# Patient Record
Sex: Female | Born: 1960 | Race: White | Hispanic: No | Marital: Single | Smoking: Never smoker
Health system: Southern US, Community
[De-identification: ages and names within clinical notes are randomized; demographics above are authoritative.]

## PROBLEM LIST (undated history)

## (undated) DIAGNOSIS — Z859 Personal history of malignant neoplasm, unspecified: Secondary | ICD-10-CM

## (undated) HISTORY — PX: TONSILLECTOMY: SUR1361

## (undated) HISTORY — PX: TYMPANOSTOMY: SHX2586

## (undated) HISTORY — PX: LEG SURGERY: SHX1003

## (undated) HISTORY — PX: OTHER SURGICAL HISTORY: SHX169

## (undated) HISTORY — PX: ABDOMINAL HYSTERECTOMY: SHX81

---

## 2017-01-24 ENCOUNTER — Emergency Department: Payer: Self-pay

## 2017-01-24 ENCOUNTER — Emergency Department
Admission: EM | Admit: 2017-01-24 | Discharge: 2017-01-27 | Disposition: A | Discharge status: Other Acute Inpatient Hospital | Payer: Self-pay | Attending: Emergency Medicine | Admitting: Emergency Medicine

## 2017-01-24 ENCOUNTER — Other Ambulatory Visit: Payer: Self-pay

## 2017-01-24 ENCOUNTER — Encounter: Payer: Self-pay | Admitting: Emergency Medicine

## 2017-01-24 DIAGNOSIS — Y9389 Activity, other specified: Secondary | ICD-10-CM | POA: Insufficient documentation

## 2017-01-24 DIAGNOSIS — S161XXA Strain of muscle, fascia and tendon at neck level, initial encounter: Secondary | ICD-10-CM | POA: Insufficient documentation

## 2017-01-24 DIAGNOSIS — Y999 Unspecified external cause status: Secondary | ICD-10-CM | POA: Insufficient documentation

## 2017-01-24 DIAGNOSIS — M25511 Pain in right shoulder: Secondary | ICD-10-CM | POA: Insufficient documentation

## 2017-01-24 DIAGNOSIS — F23 Brief psychotic disorder: Secondary | ICD-10-CM | POA: Insufficient documentation

## 2017-01-24 DIAGNOSIS — Y92410 Unspecified street and highway as the place of occurrence of the external cause: Secondary | ICD-10-CM | POA: Insufficient documentation

## 2017-01-24 DIAGNOSIS — F4325 Adjustment disorder with mixed disturbance of emotions and conduct: Secondary | ICD-10-CM

## 2017-01-24 HISTORY — DX: Personal history of malignant neoplasm, unspecified: Z85.9

## 2017-01-24 LAB — URINALYSIS, COMPLETE (UACMP) WITH MICROSCOPIC
BACTERIA UA: NONE SEEN
Bilirubin Urine: NEGATIVE
Glucose, UA: NEGATIVE mg/dL
Hgb urine dipstick: NEGATIVE
Ketones, ur: 5 mg/dL — AB
LEUKOCYTES UA: NEGATIVE
NITRITE: NEGATIVE
PH: 5 (ref 5.0–8.0)
Protein, ur: 30 mg/dL — AB
SPECIFIC GRAVITY, URINE: 1.033 — AB (ref 1.005–1.030)

## 2017-01-24 LAB — URINE DRUG SCREEN, QUALITATIVE (ARMC ONLY)
Amphetamines, Ur Screen: NOT DETECTED
Barbiturates, Ur Screen: NOT DETECTED
Benzodiazepine, Ur Scrn: NOT DETECTED
COCAINE METABOLITE, UR ~~LOC~~: NOT DETECTED
Cannabinoid 50 Ng, Ur ~~LOC~~: NOT DETECTED
MDMA (ECSTASY) UR SCREEN: NOT DETECTED
METHADONE SCREEN, URINE: NOT DETECTED
OPIATE, UR SCREEN: NOT DETECTED
Phencyclidine (PCP) Ur S: NOT DETECTED
TRICYCLIC, UR SCREEN: NOT DETECTED

## 2017-01-24 LAB — CBC WITH DIFFERENTIAL/PLATELET
BASOS ABS: 0.1 10*3/uL (ref 0–0.1)
Basophils Relative: 1 %
EOS ABS: 0.2 10*3/uL (ref 0–0.7)
EOS PCT: 2 %
HCT: 40.9 % (ref 35.0–47.0)
Hemoglobin: 13.7 g/dL (ref 12.0–16.0)
LYMPHS PCT: 32 %
Lymphs Abs: 3.1 10*3/uL (ref 1.0–3.6)
MCH: 29 pg (ref 26.0–34.0)
MCHC: 33.5 g/dL (ref 32.0–36.0)
MCV: 86.6 fL (ref 80.0–100.0)
MONO ABS: 0.8 10*3/uL (ref 0.2–0.9)
Monocytes Relative: 9 %
Neutro Abs: 5.5 10*3/uL (ref 1.4–6.5)
Neutrophils Relative %: 56 %
PLATELETS: 250 10*3/uL (ref 150–440)
RBC: 4.72 MIL/uL (ref 3.80–5.20)
RDW: 14.2 % (ref 11.5–14.5)
WBC: 9.7 10*3/uL (ref 3.6–11.0)

## 2017-01-24 LAB — SALICYLATE LEVEL

## 2017-01-24 LAB — COMPREHENSIVE METABOLIC PANEL
ALK PHOS: 76 U/L (ref 38–126)
ALT: 24 U/L (ref 14–54)
AST: 26 U/L (ref 15–41)
Albumin: 4.7 g/dL (ref 3.5–5.0)
Anion gap: 9 (ref 5–15)
BILIRUBIN TOTAL: 1.3 mg/dL — AB (ref 0.3–1.2)
BUN: 14 mg/dL (ref 6–20)
CALCIUM: 9.6 mg/dL (ref 8.9–10.3)
CHLORIDE: 105 mmol/L (ref 101–111)
CO2: 25 mmol/L (ref 22–32)
CREATININE: 0.83 mg/dL (ref 0.44–1.00)
Glucose, Bld: 158 mg/dL — ABNORMAL HIGH (ref 65–99)
Potassium: 3.6 mmol/L (ref 3.5–5.1)
Sodium: 139 mmol/L (ref 135–145)
TOTAL PROTEIN: 8.6 g/dL — AB (ref 6.5–8.1)

## 2017-01-24 LAB — ACETAMINOPHEN LEVEL

## 2017-01-24 LAB — ETHANOL

## 2017-01-24 MED ORDER — OLANZAPINE 2.5 MG PO TABS
2.5000 mg | ORAL_TABLET | Freq: Every day | ORAL | Status: DC
  Administered 2017-01-24 – 2017-01-26 (×3): 2.5 mg via ORAL
  Filled 2017-01-24 (×3): qty 1

## 2017-01-24 MED ORDER — ACETAMINOPHEN 325 MG PO TABS
ORAL_TABLET | ORAL | Status: AC
  Filled 2017-01-24: qty 2

## 2017-01-24 MED ORDER — OLANZAPINE 5 MG PO TABS: 7.5000 mg | ORAL_TABLET | Freq: Two times a day (BID) | ORAL | Status: DC | PRN

## 2017-01-24 MED ORDER — ACETAMINOPHEN 325 MG PO TABS
650.0000 mg | ORAL_TABLET | Freq: Once | ORAL | Status: AC
  Administered 2017-01-24: 650 mg via ORAL
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
## 2017-01-24 NOTE — ED Notes (Signed)
IVC 

## 2017-01-24 NOTE — ED Provider Notes (Signed)
Medical screening examination/treatment/procedure(s) were conducted as a shared visit with non-physician practitioner(s) and myself.  I personally evaluated the patient during the encounter.  Agree with his trauma evaluation including x-ray of the shoulder and cervical spine, no need for CT scans at this time, low suspicion of intracranial hemorrhage or vascular, hollow viscus, or solid organ injury.  Notably, patient has bizarre behavior and appears to be manic. She was IVC by her family. We will maintain this pending psychiatry evaluation. We'll give calming agents as needed for agitation. Patient is currently calm, but with any mention of mental health concerned she becomes irritated and confrontational.    ----------------------------------------- 6:35 PM on 01/24/2017 -----------------------------------------  X-rays of C-spine and shoulder are unremarkable. Psych consult ordered. Patient informed of plan here in the ED.     ----------------------------------------- 7:29 PM on 01/24/2017 -----------------------------------------  Daughter called, reported to the nurse that patient has been having episodes like this over the past 3 years ever since her husband died and that January 8 is the anniversary of her husband's death and so her mom's mental health has deteriorated since then again. Patient is medically stable. Labs are pending. We'll await psychiatric evaluation.    Sharman CheekStafford, Tava Peery, MD 01/24/17 1930

## 2017-01-24 NOTE — ED Notes (Signed)
Pt ambulatory with steady gait to BHU with Susy FrizzleMatt, RN and ODS officer
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
## 2017-01-24 NOTE — ED Notes (Signed)
Matt, RN in with pt with Zyprexa printout and medications as ordered

## 2017-01-24 NOTE — ED Notes (Addendum)
Pt remains calm and cooperative; pt understands she'll be moving soon to the BHU; discussed ordered medication; pt would like a print out about the medication before taking;

## 2017-01-24 NOTE — ED Notes (Signed)

## 2017-01-24 NOTE — ED Notes (Signed)
Matt, RN at bedside to discuss with pt the IVC order and moving her to BHU: pt remains calm and cooperative

## 2017-01-24 NOTE — ED Notes (Signed)
Pt. Talking to TTS in room.

## 2017-01-24 NOTE — ED Notes (Signed)
Blood drawn by Selena BattenKim, RN; pt tolerated well; [facility] and awaiting evaluation

## 2017-01-24 NOTE — ED Notes (Signed)
Patients daughter called to check on status of mother.  Lauren(daughter) gave phone #[phone].  Stated she was the one who started IVC paperwork.  Lauren states Zella BallRobin has been displaying more frequent odd behavior in the past couple months.  Lauren states Zella BallRobin has a long hx of mental problems which resulted in her being raised by her grandparents(Biana's parents).

## 2017-01-24 NOTE — ED Notes (Signed)
BEHAVIORAL HEALTH ROUNDING Patient sleeping: Yes.   Patient alert and oriented: yes Behavior appropriate: no; If no, describe: paranoid, delusional Nutrition and fluids offered: Yes  Toileting and hygiene offered: Yes  Sitter present: not applicable Law enforcement present: Yes

## 2017-01-24 NOTE — ED Notes (Signed)
Pt. Informed of camera in unit and 15 minute checks.
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
## 2017-01-24 NOTE — ED Notes (Signed)
SOC called for consult  [phone]

## 2017-01-24 NOTE — ED Notes (Signed)
[facility] and patiently currently speaking with him
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
## 2017-01-24 NOTE — ED Notes (Signed)
Pt seen ambulating around flex wait area while waiting for room

## 2017-01-24 NOTE — ED Notes (Signed)
Called pt's daughter, Leotis ShamesLauren at [phone], at [facility] of [facility] to let her know he would be calling from a strange area code; she will be waiting for his call

## 2017-01-24 NOTE — ED Provider Notes (Signed)
Discussed with psychiatrist, recommends inpatient psychiatric hospitalization under involuntary commitment. Recommend starting Zyprexa which I will order. We'll await behavioral medicine placement.   Sharman CheekStafford, Eban Weick, MD 01/24/17 2008

## 2017-01-25 MED ORDER — ACETAMINOPHEN 325 MG PO TABS
650.0000 mg | ORAL_TABLET | Freq: Once | ORAL | Status: AC
  Administered 2017-01-25: 650 mg via ORAL
  Filled 2017-01-25: qty 2

## 2017-01-25 MED ORDER — GUAIFENESIN 200 MG PO TABS
600.0000 mg | ORAL_TABLET | Freq: Four times a day (QID) | ORAL | Status: DC | PRN
  Filled 2017-01-25: qty 3

## 2017-01-25 MED ORDER — BENZONATATE 100 MG PO CAPS
100.0000 mg | ORAL_CAPSULE | Freq: Two times a day (BID) | ORAL | Status: DC | PRN
  Administered 2017-01-25 – 2017-01-27 (×4): 100 mg via ORAL
  Filled 2017-01-25 (×4): qty 1

## 2017-01-25 MED ORDER — GUAIFENESIN ER 600 MG PO TB12
600.0000 mg | ORAL_TABLET | Freq: Two times a day (BID) | ORAL | Status: DC | PRN
  Administered 2017-01-25 – 2017-01-27 (×4): 600 mg via ORAL
  Filled 2017-01-25 (×4): qty 1

## 2017-01-25 MED ORDER — ACETAMINOPHEN 325 MG PO TABS
650.0000 mg | ORAL_TABLET | Freq: Four times a day (QID) | ORAL | Status: DC | PRN
  Administered 2017-01-25 – 2017-01-26 (×2): 650 mg via ORAL
  Filled 2017-01-25 (×2): qty 2

## 2017-01-25 MED ORDER — GUAIFENESIN 200 MG PO TABS
400.0000 mg | ORAL_TABLET | Freq: Four times a day (QID) | ORAL | Status: DC | PRN
  Filled 2017-01-25: qty 2

## 2017-01-25 MED ORDER — BENZONATATE 100 MG PO CAPS
ORAL_CAPSULE | ORAL | Status: AC
  Administered 2017-01-25: 100 mg via ORAL
  Filled 2017-01-25: qty 1

## 2017-01-25 NOTE — ED Notes (Signed)
Pt said she wants her best friend, Ruth Hood [phone]) to be her emergency contact and she does not want any information shared with her daughter, Ruth Hood.

## 2017-01-25 NOTE — ED Notes (Signed)
Pt had a visitor, Donell SievertCraig Thompson, who said he was her attorney and asked to see her IVC paperwork. Spoke with [facility], [facility], and Keturah Barreoni Barlett, Unit Director, who said that papers could not be shared and must come from medical records. That number was given to Mr. Janee Mornhompson, who was escorted off unit after visitation hours.

## 2017-01-25 NOTE — ED Notes (Signed)
IVC 

## 2017-01-25 NOTE — ED Provider Notes (Signed)
-----------------------------------------   7:00 AM on 01/25/2017 -----------------------------------------   Blood pressure (!) 147/85, pulse 93, temperature (!) 97.3 F (36.3 C), temperature source Oral, resp. rate 20, height 5\' 4"  (1.626 m), weight 68 kg (150 lb), SpO2 96 %.  The patient had no acute events since last update.  Calm and cooperative at this time.  Disposition is pending Psychiatry/Behavioral Medicine team recommendations.     Rebecka ApleyWebster, Allison P, MD 01/25/17 0700

## 2017-01-25 NOTE — ED Notes (Signed)
Lunch tray given. 

## 2017-01-25 NOTE — ED Notes (Signed)
Dinner tray given
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
## 2017-01-25 NOTE — ED Notes (Signed)
Pt insists she would like her code to be "[employer]." She does not want her daughter to get updates or have her code.

## 2017-01-25 NOTE — BH Assessment (Signed)
Pending review with [facility] ([phone]) 

## 2017-01-25 NOTE — ED Notes (Signed)
After the [facility], this Clinical research associatewriter wrote down numbers from pt's phone. Pt was made aware that this was the one time her phone would be accessed while pt was in the BHU. Pt verbalized understanding.

## 2017-01-25 NOTE — ED Notes (Addendum)
Pt is in dayroom watching Delrae RendJoel Osteen on TV. She has been labile this a.m. -- polite and cooperative at times, irritable when requests go against unit rules. Pt says she wanted to go to law school and took two law classes, "so I know my rights and it is a violation of my rights" that she is no longer in possession of a sheet of paper with three phone numbers on it and she can not access her cell phone.   She denies SI, HI, and AVH. She denies feelings of paranoia. She is guarded and suspicious at times. She is now speaking with Loraine Gripheryl AC by request.

## 2017-01-25 NOTE — BH Assessment (Signed)
Referral information for Psychiatric Hospitalization faxed to;                           [facility] [phone]),        [facility] [phone] or [phone])               [facility] [phone]),         [facility] [phone]),         [phone]),         [place] [phone] or [phone]),               [place] [phone]

## 2017-01-25 NOTE — BH Assessment (Signed)
Per [facility] BMU "Rounding" Attending Physician (Dr. Isbell) the unit census will be holding at 22 till further notice. Thus, patient is being referred out. 
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
## 2017-01-26 DIAGNOSIS — F29 Unspecified psychosis not due to a substance or known physiological condition: Secondary | ICD-10-CM

## 2017-01-26 DIAGNOSIS — F4325 Adjustment disorder with mixed disturbance of emotions and conduct: Secondary | ICD-10-CM

## 2017-01-26 MED ORDER — IBUPROFEN 800 MG PO TABS
800.0000 mg | ORAL_TABLET | Freq: Three times a day (TID) | ORAL | Status: AC
  Administered 2017-01-26 (×2): 800 mg via ORAL
  Filled 2017-01-26 (×2): qty 1

## 2017-01-26 NOTE — ED Notes (Signed)
Retrieved telephone from patient.
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
## 2017-01-26 NOTE — ED Notes (Signed)
IVC/Pending Placement 

## 2017-01-26 NOTE — BH Assessment (Addendum)
Writer followed up with referrals;   [facility] ([phone]), No appropriate beds.   [facility] ([phone]), Information refaxed   [facility] ([phone]), Declined due to acuity.   [facility] ([phone]), Information refaxed   [facility] ([phone]), Pending Review   [facility] [phone]), Unable to reach anyone   [place] ([phone]), Pending review.   [facility] [phone]), Unable to reach anyone   [facility] ([phone]), Information faxed.

## 2017-01-26 NOTE — ED Notes (Signed)
Pt. Alert and oriented, warm and dry, in no distress. Pt. Denies SI, HI, and AVH. Pt complaining of coughing. Pt. Encouraged to let nursing staff know of any concerns or needs.

## 2017-01-26 NOTE — ED Notes (Signed)

## 2017-01-26 NOTE — ED Notes (Addendum)
Patient currently on the telephone speaking with her "patient advocate."  She is identified as a lady named Ruth Hood.  Patient states that when her advocate called earlier (during phone time) the telephone was charging so she was not able to speak with her.

## 2017-01-26 NOTE — ED Notes (Signed)
Patient come to nurses station complaining of coughing. This Clinical research associatewriter spoke to EDP. Orders received for medication for coughing.

## 2017-01-26 NOTE — Progress Notes (Signed)
Pt A & O X4. Denies SI, HI, AVH and pain at this time. Presents anxious with labile mood throughout this shift. Tearful at times, demanding, abrupt, brief and needy on interactions as well. Per pt "I will call my lawyer, he will come back in here then you people will do as I want, I also want to talk to a hospital administrator" when she doesn't get whatever she demands immediately or when unit rules are reviewed. Pt was assessed my EDP for coughing episodes and pain unrelieved by PRN Tylenol; new order for Motrin 800 mg PO (see emar) received. All Medications given as per MD's orders. Pt is compliant with medications when offered. Denies adverse drug reactions at this time. Emotional support and availability provided to pt throughout this shift. Pt has not exhibited self harm gestures thus far.

## 2017-01-26 NOTE — ED Provider Notes (Signed)
-----------------------------------------   7:10 AM on 01/26/2017 -----------------------------------------   Blood pressure 124/83, pulse 80, temperature (!) 97.5 F (36.4 C), temperature source Oral, resp. rate 18, height 5\' 4"  (1.626 m), weight 68 kg (150 lb), SpO2 100 %.  The patient had no acute events since last update.  Calm and cooperative at this time.  Disposition is pending Psychiatry/Behavioral Medicine team recommendations.     Myrna BlazerSchaevitz, Taletha Twiford Matthew, MD 01/26/17 [phone]

## 2017-01-26 NOTE — ED Notes (Signed)
AC at bedside speaking with patient.

## 2017-01-26 NOTE — ED Notes (Signed)
Patient requesting to take a shower. This Clinical research associatewriter got patient items to take a shower.

## 2017-01-26 NOTE — Progress Notes (Signed)
CH rounding on unit and patient asked if we could talk. Patient wanted her attorneys phone number, CH provided this at end of consult. Patient would like information on advanced directive; CH will provide a copy before end of shift, as there are none on the unit. Patient was talkative and asked what services we provided and what we had to offer. Patient [facility]. CH inquired with staff and staff stated patient had a bible and could have literature. Patient continued asking staff for various things and CH exited the meeting room.   01/26/17 1445  Clinical Encounter Type  Visited With Patient;Health care provider  Visit Type Initial;Spiritual support;Behavioral Health  Referral From Patient  Spiritual Encounters  Spiritual Needs Brochure;Emotional
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
## 2017-01-26 NOTE — ED Notes (Signed)
Pt requesting a ginger ale stating her stomach is up set. This Clinical research associatewriter gave patient a ginger ale.
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
## 2017-01-27 ENCOUNTER — Other Ambulatory Visit: Payer: Self-pay

## 2017-01-27 ENCOUNTER — Inpatient Hospital Stay
Admission: AD | Admit: 2017-01-27 | Discharge: 2017-01-29 | DRG: 882 | Disposition: A | Discharge status: Home / Self Care | Payer: No Typology Code available for payment source | Attending: Psychiatry | Admitting: Psychiatry

## 2017-01-27 DIAGNOSIS — F4325 Adjustment disorder with mixed disturbance of emotions and conduct: Principal | ICD-10-CM | POA: Diagnosis present

## 2017-01-27 DIAGNOSIS — F310 Bipolar disorder, current episode hypomanic: Secondary | ICD-10-CM | POA: Diagnosis present

## 2017-01-27 DIAGNOSIS — K219 Gastro-esophageal reflux disease without esophagitis: Secondary | ICD-10-CM | POA: Diagnosis present

## 2017-01-27 DIAGNOSIS — Z90710 Acquired absence of both cervix and uterus: Secondary | ICD-10-CM

## 2017-01-27 DIAGNOSIS — F29 Unspecified psychosis not due to a substance or known physiological condition: Secondary | ICD-10-CM | POA: Diagnosis present

## 2017-01-27 DIAGNOSIS — G4700 Insomnia, unspecified: Secondary | ICD-10-CM | POA: Diagnosis present

## 2017-01-27 DIAGNOSIS — Z882 Allergy status to sulfonamides status: Secondary | ICD-10-CM

## 2017-01-27 MED ORDER — TEMAZEPAM 15 MG PO CAPS: 15.0000 mg | ORAL_CAPSULE | Freq: Every evening | ORAL | Status: DC | PRN

## 2017-01-27 MED ORDER — HYDROXYZINE HCL 25 MG PO TABS: 25.0000 mg | ORAL_TABLET | Freq: Three times a day (TID) | ORAL | Status: DC | PRN

## 2017-01-27 MED ORDER — IBUPROFEN 800 MG PO TABS
800.0000 mg | ORAL_TABLET | Freq: Three times a day (TID) | ORAL | Status: AC
  Filled 2017-01-27: qty 1

## 2017-01-27 MED ORDER — ALUM & MAG HYDROXIDE-SIMETH 200-200-20 MG/5ML PO SUSP
30.0000 mL | ORAL | Status: DC | PRN
  Administered 2017-01-27 – 2017-01-28 (×2): 30 mL via ORAL
  Filled 2017-01-27 (×2): qty 30

## 2017-01-27 MED ORDER — OLANZAPINE 10 MG PO TABS
10.0000 mg | ORAL_TABLET | Freq: Every day | ORAL | Status: DC
  Administered 2017-01-27 – 2017-01-28 (×2): 10 mg via ORAL
  Filled 2017-01-27 (×2): qty 1

## 2017-01-27 MED ORDER — OLANZAPINE 5 MG PO TABS: 2.5000 mg | ORAL_TABLET | Freq: Every day | ORAL | Status: DC

## 2017-01-27 MED ORDER — BENZONATATE 100 MG PO CAPS
100.0000 mg | ORAL_CAPSULE | Freq: Two times a day (BID) | ORAL | Status: DC | PRN
  Administered 2017-01-27 – 2017-01-28 (×2): 100 mg via ORAL
  Filled 2017-01-27 (×3): qty 1

## 2017-01-27 MED ORDER — IBUPROFEN 800 MG PO TABS
800.0000 mg | ORAL_TABLET | Freq: Three times a day (TID) | ORAL | Status: DC
  Administered 2017-01-27: 800 mg via ORAL

## 2017-01-27 MED ORDER — MAGNESIUM HYDROXIDE 400 MG/5ML PO SUSP: 30.0000 mL | Freq: Every day | ORAL | Status: DC | PRN

## 2017-01-27 MED ORDER — ACETAMINOPHEN 325 MG PO TABS
650.0000 mg | ORAL_TABLET | Freq: Four times a day (QID) | ORAL | Status: DC | PRN
  Administered 2017-01-27 – 2017-01-29 (×2): 650 mg via ORAL
  Filled 2017-01-27 (×2): qty 2

## 2017-01-27 MED ORDER — OLANZAPINE 5 MG PO TABS: 7.5000 mg | ORAL_TABLET | Freq: Two times a day (BID) | ORAL | Status: DC | PRN

## 2017-01-27 MED ORDER — ACETAMINOPHEN 325 MG PO TABS: 650.0000 mg | ORAL_TABLET | Freq: Four times a day (QID) | ORAL | Status: DC | PRN

## 2017-01-27 MED ORDER — GUAIFENESIN ER 600 MG PO TB12
600.0000 mg | ORAL_TABLET | Freq: Two times a day (BID) | ORAL | Status: DC | PRN
  Administered 2017-01-29: 600 mg via ORAL
  Filled 2017-01-27 (×2): qty 1

## 2017-01-27 MED ORDER — TRAZODONE HCL 100 MG PO TABS: 100.0000 mg | ORAL_TABLET | Freq: Every evening | ORAL | Status: DC | PRN

## 2017-01-27 NOTE — ED Notes (Signed)
Pt IVC pending placement to [facility]. 

## 2017-01-27 NOTE — ED Notes (Signed)
Patient IVC.  No signature pad for e signature.
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
## 2017-01-27 NOTE — ED Notes (Addendum)
Preparing patient for transfer to BMU 

## 2017-01-27 NOTE — ED Notes (Addendum)
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  You know that right?  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  It's only going to take me 2 to 3 hours.  It's going to be a best seller.  It's going to be a [employer] movie."

## 2017-01-27 NOTE — BH Assessment (Signed)
[facility] by Dr. Toni Amendlapacs.  Attending Physician will be Dr. Karie SodaPucilowksa.   Patient has been [facility] to room 313, [facility] Charge Nurse [place].   Intake Paper Work has been signed and placed on patient chart.  ER staff is aware of the admission (Emilie, ER Kathrynn SpeedSectary, Dr. Shaune PollackLord, , ER MD; Bertram SavinLord & Joshua, Patient Access).

## 2017-01-27 NOTE — Consult Note (Signed)
[facility] Face-to-Face Psychiatry Consult   Reason for Consult: Consult for 57 year old woman with a history of odd behavior.  Follow-up from notes yesterday Referring Physician: Juliette Alcide Patient Identification: Ruth Hood MRN:  [id number] Principal Diagnosis: Psychosis [facility]) Diagnosis:   Patient Active Problem List   Diagnosis Date Noted  . Psychosis (HCC) [F29] 01/26/2017    Priority: High    Total Time spent with patient: 30 minutes  Subjective:   Ruth Hood is a 57 y.o. female patient admitted with "when can I go home".  HPI: Patient interviewed.  See notes from yesterday.  This is a 57 year old woman brought into the hospital and placed under IVC by family who alleged that there is been more bizarre behavior outside the hospital.  Some bizarre behavior noted by staff when she first presented.  Here in the emergency room overnight the patient remains intrusive and demanding although not obviously psychotic.  She was compliant with medicine including olanzapine.  Past Psychiatric History: Unclear if she is ever had any psychiatric contact.  She denies it.  Risk to Self: Suicidal Ideation: No-Not Currently/Within Last 6 Months Suicidal Intent: No Is patient at risk for suicide?: No Suicidal Plan?: No Access to Means: No What has been your use of drugs/alcohol within the last 12 months?: Pt reports she ocassionally drinks alcohol How many times?: 0 Other Self Harm Risks: N/A Triggers for Past Attempts: None known Intentional Self Injurious Behavior: None Risk to Others: Homicidal Ideation: No Thoughts of Harm to Others: No Current Homicidal Intent: No Current Homicidal Plan: No Access to Homicidal Means: No Identified Victim: N/A History of harm to others?: No Assessment of Violence: On admission Violent Behavior Description: N/A Does patient have access to weapons?: No Criminal Charges Pending?: No Does patient have a court date: No Prior Inpatient Therapy: Prior Inpatient  Therapy: No Prior Therapy Dates: N/A Prior Therapy Facilty/Provider(s): N/A Reason for Treatment: N/A Prior Outpatient Therapy: Prior Outpatient Therapy: No Prior Therapy Dates: N/A Prior Therapy Facilty/Provider(s): N/A Reason for Treatment: N/A Does patient have an ACCT team?: No Does patient have Intensive In-House Services?  : No Does patient have [facility] services? : No Does patient have P4CC services?: No  Past Medical History:  Past Medical History:  Diagnosis Date  . History of cancer    "it had cancer cells"    Past Surgical History:  Procedure Laterality Date  . ABDOMINAL HYSTERECTOMY    . arm surgery    . LEG SURGERY    . TONSILLECTOMY    . TYMPANOSTOMY     Family History: History reviewed. No pertinent family history. Family Psychiatric  History: Unknown.  She claims that her biological daughter is mentally ill.  I am not sure who to believe. Social History:  Social History   Substance and Sexual Activity  Alcohol Use No  . Frequency: Never     Social History   Substance and Sexual Activity  Drug Use No    Social History   Socioeconomic History  . Marital status: Single    Spouse name: None  . Number of children: None  . Years of education: None  . Highest education level: None  Social Needs  . Financial resource strain: None  . Food insecurity - worry: None  . Food insecurity - inability: None  . Transportation needs - medical: None  . Transportation needs - non-medical: None  Occupational History  . None  Tobacco Use  . Smoking status: Never Smoker  . Smokeless tobacco:  Never Used  Substance and Sexual Activity  . Alcohol use: No    Frequency: Never  . Drug use: No  . Sexual activity: None  Other Topics Concern  . None  Social History Narrative  . None   Additional Social History:    Allergies:   Allergies  Allergen Reactions  . Sulfa Antibiotics     Unknown as kid    Labs: No results found for this or any previous visit  (from the past 48 hour(s)).  Current Facility-Administered Medications  Medication Dose Route Frequency Provider Last Rate Last Dose  . acetaminophen (TYLENOL) tablet 650 mg  650 mg Oral Q6H PRN Dionne BucySiadecki, Sebastian, MD   650 mg at 01/26/17 0858  . benzonatate (TESSALON) capsule 100 mg  100 mg Oral BID PRN Minna AntisPaduchowski, Kevin, MD   100 mg at 01/27/17 1134  . guaiFENesin (MUCINEX) 12 hr tablet 600 mg  600 mg Oral BID PRN Dionne BucySiadecki, Sebastian, MD   600 mg at 01/27/17 1205  . ibuprofen (ADVIL,MOTRIN) tablet 800 mg  800 mg Oral TID Arnaldo NatalMalinda, Paul F, MD   800 mg at 01/27/17 1344  . OLANZapine (ZYPREXA) tablet 2.5 mg  2.5 mg Oral QHS Sharman CheekStafford, Phillip, MD   2.5 mg at 01/26/17 2259  . OLANZapine (ZYPREXA) tablet 7.5 mg  7.5 mg Oral BID PRN Sharman CheekStafford, Phillip, MD       No current outpatient medications on file.    Musculoskeletal: Strength & Muscle Tone: within normal limits Gait & Station: normal Patient leans: N/A  Psychiatric Specialty Exam: Physical Exam  Nursing note reviewed. Constitutional: She appears well-developed and well-nourished.  HENT:  Head: Normocephalic and atraumatic.  Eyes: Conjunctivae are normal. Pupils are equal, round, and reactive to light.  Neck: Normal range of motion.  Cardiovascular: Regular rhythm and normal heart sounds.  Respiratory: Effort normal. No respiratory distress.  GI: Soft.  Musculoskeletal: Normal range of motion.  Neurological: She is alert.  Skin: Skin is warm and dry.  Psychiatric: Her speech is normal. Her mood appears anxious. She is agitated and hyperactive. She is not aggressive and not combative. Thought content is not paranoid. Cognition and memory are normal. She expresses impulsivity. She expresses no homicidal and no suicidal ideation.    Review of Systems  Constitutional: Negative.   HENT: Negative.   Eyes: Negative.   Respiratory: Negative.   Cardiovascular: Negative.   Gastrointestinal: Negative.   Musculoskeletal: Negative.    Skin: Negative.   Neurological: Negative.   Psychiatric/Behavioral: Negative for depression, hallucinations, memory loss, substance abuse and suicidal ideas. The patient is nervous/anxious. The patient does not have insomnia.     Blood pressure 117/66, pulse 83, temperature (!) 97.5 F (36.4 C), temperature source Oral, resp. rate 18, height 5\' 4"  (1.626 m), weight 68 kg (150 lb), SpO2 100 %.Body mass index is 25.75 kg/m.  General Appearance: Casual  Eye Contact:  Good  Speech:  Clear and Coherent  Volume:  Normal  Mood:  Euthymic  Affect:  Appropriate  Thought Process:  Goal Directed  Orientation:  Full (Time, Place, and Person)  Thought Content:  Logical and Rumination  Suicidal Thoughts:  No  Homicidal Thoughts:  No  Memory:  Immediate;   Good Recent;   Fair Remote;   Fair  Judgement:  Fair  Insight:  Shallow  Psychomotor Activity:  Restlessness  Concentration:  Concentration: Fair  Recall:  FiservFair  Fund of Knowledge:  Fair  Language:  Fair  Akathisia:  No  Handed:  Right  AIMS (if indicated):     Assets:  Architect Housing Physical Health Resilience  ADL's:  Intact  Cognition:  WNL  Sleep:        Treatment Plan Summary: Plan 22 woman who continues to have some bizarre and odd behavior although she has not been violent or aggressive or threatening.  IVC is already in place.  Bed is reportedly going to be available today.  It still seems the better judgment to proceed with the original plan of admitting her to the psychiatric unit for more full evaluation.  All of this explained to the patient.  No change to orders for now.  He should be able to get downstairs by the end of the day.  Disposition: Recommend psychiatric Inpatient admission when medically cleared.  Mordecai Rasmussen, MD 01/27/2017 6:05 PM
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
## 2017-01-27 NOTE — ED Notes (Signed)
Patient at nurse's station requesting water.  Water given to patient.

## 2017-01-27 NOTE — ED Provider Notes (Signed)
-----------------------------------------   7:47 AM on 01/27/2017 -----------------------------------------   Blood pressure 132/69, pulse 78, temperature (!) 97.4 F (36.3 C), temperature source Axillary, resp. rate 16, height 5\' 4"  (1.626 m), weight 68 kg (150 lb), SpO2 100 %.  The patient had no acute events since last update.  Calm and cooperative at this time.  Disposition is pending Psychiatry/Behavioral Medicine team recommendations.     Rockne MenghiniNorman, Anne-Caroline, MD 01/27/17 [id number]
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
## 2017-01-27 NOTE — Progress Notes (Signed)
Pt visible in milieu for majority of this shift.A & O X4. Denies SI, HI, AVH "I'm ready to go home, I'm loosing money everyday I'm in here, we are coming to the end of another business day". Pt presents restless / fidgety with rapid speech. Mood remains labile. Compliant with medications when offered. Denies adverse drug reactions. Pt pending placement to BMU, awaiting room assignment. Pt is aware of transfer and is in agreement. Safety checks maintained at Q 15 minutes intervals without self harm gestures thus far shift.

## 2017-01-28 DIAGNOSIS — F4325 Adjustment disorder with mixed disturbance of emotions and conduct: Principal | ICD-10-CM

## 2017-01-28 LAB — TSH: TSH: 2.075 u[IU]/mL (ref 0.350–4.500)

## 2017-01-28 LAB — HEMOGLOBIN A1C
HEMOGLOBIN A1C: 5.5 % (ref 4.8–5.6)
Mean Plasma Glucose: 111.15 mg/dL

## 2017-01-28 LAB — LIPID PANEL
CHOLESTEROL: 192 mg/dL (ref 0–200)
HDL: 53 mg/dL (ref >40)
LDL Cholesterol: 114 mg/dL — ABNORMAL HIGH (ref 0–99)
Total CHOL/HDL Ratio: 3.6 RATIO
Triglycerides: 127 mg/dL (ref <150)
VLDL: 25 mg/dL (ref 0–40)

## 2017-01-28 MED ORDER — TRAZODONE HCL 100 MG PO TABS
100.0000 mg | ORAL_TABLET | Freq: Every day | ORAL | Status: DC
  Administered 2017-01-28: 100 mg via ORAL
  Filled 2017-01-28: qty 1

## 2017-01-28 MED ORDER — PANTOPRAZOLE SODIUM 40 MG PO TBEC
40.0000 mg | DELAYED_RELEASE_TABLET | Freq: Every day | ORAL | Status: DC
  Administered 2017-01-28 – 2017-01-29 (×2): 40 mg via ORAL
  Filled 2017-01-28 (×2): qty 1
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
## 2017-01-28 NOTE — Plan of Care (Signed)
  Education: Utilization of techniques to improve thought processes will improve 01/28/2017 1552 - Not Progressing by Elige Radonobb, Nevaeha Finerty B, RN Note Does not think anything is wrong and does not feel that she needs to be here   Education: Knowledge of the prescribed therapeutic regimen will improve 01/28/2017 1552 - Progressing by Elige Radonobb, Glendal Cassaday B, RN Note Able to verbalize medications she is taking and what they are for 01/28/2017 1145 - Progressing by Elige Radonobb, Bejamin Hackbart B, RN   Coping: Ability to cope will improve 01/28/2017 1552 - Progressing by Elige Radonobb, Maleni Seyer B, RN   Coping: Ability to verbalize feelings will improve 01/28/2017 1552 - Progressing by Elige Radonobb, Felica Chargois B, RN Note Verbalizes feelings and concerns without difficulty.     Health Behavior/Discharge Planning: Compliance with therapeutic regimen will improve 01/28/2017 1552 - Progressing by Elige Radonobb, Colton Engdahl B, RN Note Attending groups, receptive to treatment regeiment   Safety: Ability to disclose and discuss suicidal ideas will improve 01/28/2017 1552 - Progressing by Elige Radonobb, Bayyinah Dukeman B, RN Note Denies SI  or any thoughts of harming self   Self-Concept: Ability to verbalize positive feelings about self will improve 01/28/2017 1552 - Progressing by Elige Radonobb, Daveon Arpino B, RN
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
## 2017-01-28 NOTE — BHH Group Notes (Signed)
  01/28/2017  Time: 0930  Type of Therapy/Topic:  Group Therapy:  Emotion Regulation  Participation Level:  Active   Description of Group:    The purpose of this group is to assist patients in learning to regulate negative emotions and experience positive emotions. Patients will be guided to discuss ways in which they have been vulnerable to their negative emotions. These vulnerabilities will be juxtaposed with experiences of positive emotions or situations, and patients will be challenged to use positive emotions to combat negative ones. Special emphasis will be placed on coping with negative emotions in conflict situations, and patients will process healthy conflict resolution skills.  Therapeutic Goals: 1. Patient will identify two positive emotions or experiences to reflect on in order to balance out negative emotions 2. Patient will label two or more emotions that they find the most difficult to experience 3. Patient will demonstrate positive conflict resolution skills through discussion and/or role plays  Summary of Patient Progress: Pt continues to work towards their tx goals but has not yet reached them. Pt was able to appropriately participate in group discussion, and was able to offer support/validation to other group members. Pt reported two things she can do to stay calm in the moment, when feeling upset are, "speaking things into existence and releasing endorphins." Pt was verbally intrusive at times, but was able to accept redirection when offered. Pt reported that she would like to lead a breathing exercise daily on the unit.   Therapeutic Modalities:   Cognitive Behavioral Therapy Feelings Identification Dialectical Behavioral Therapy  Heidi DachKelsey Baudelio Karnes, MSW, LCSW 01/28/2017 10:28 AM

## 2017-01-28 NOTE — Tx Team (Signed)
Interdisciplinary Treatment and Diagnostic Plan Update  01/28/2017 Time of Session: 10:30am Ruth Hood MRN: [id number]  Principal Diagnosis: Bipolar I disorder, current or most recent episode manic, with psychotic features (HCC)  Secondary Diagnoses: Principal Problem:   Bipolar I disorder, current or most recent episode manic, with psychotic features (HCC)   Current Medications:  Current Facility-Administered Medications  Medication Dose Route Frequency Provider Last Rate Last Dose  . acetaminophen (TYLENOL) tablet 650 mg  650 mg Oral Q6H PRN Clapacs, Jackquline DenmarkJohn T, MD   650 mg at 01/27/17 2242  . alum & mag hydroxide-simeth (MAALOX/MYLANTA) 200-200-20 MG/5ML suspension 30 mL  30 mL Oral Q4H PRN Clapacs, Jackquline DenmarkJohn T, MD   30 mL at 01/28/17 0249  . benzonatate (TESSALON) capsule 100 mg  100 mg Oral BID PRN Clapacs, Jackquline DenmarkJohn T, MD   100 mg at 01/27/17 2231  . guaiFENesin (MUCINEX) 12 hr tablet 600 mg  600 mg Oral BID PRN Clapacs, John T, MD      . hydrOXYzine (ATARAX/VISTARIL) tablet 25 mg  25 mg Oral TID PRN Clapacs, John T, MD      . magnesium hydroxide (MILK OF MAGNESIA) suspension 30 mL  30 mL Oral Daily PRN Clapacs, John T, MD      . OLANZapine (ZYPREXA) tablet 10 mg  10 mg Oral QHS Pucilowska, Jolanta B, MD   10 mg at 01/27/17 2217  . OLANZapine (ZYPREXA) tablet 7.5 mg  7.5 mg Oral BID PRN Clapacs, John T, MD      . temazepam (RESTORIL) capsule 15 mg  15 mg Oral QHS PRN Pucilowska, Jolanta B, MD       PTA Medications: No medications prior to admission.    Patient Stressors: Health problems Medication change or noncompliance Occupational concerns Traumatic event  Patient Strengths: Average or above average intelligence Capable of independent living Communication skills General fund of knowledge Motivation for treatment/growth Religious Affiliation  Treatment Modalities: Medication Management, Group therapy, Case management,  1 to 1 session with clinician, Psychoeducation, Recreational  therapy.   Physician Treatment Plan for Primary Diagnosis: Bipolar I disorder, current or most recent episode manic, with psychotic features (HCC) Long Term Goal(s):     Short Term Goals:    Medication Management: Evaluate patient's response, side effects, and tolerance of medication regimen.  Therapeutic Interventions: 1 to 1 sessions, Unit Group sessions and Medication administration.  Evaluation of Outcomes: Progressing  Physician Treatment Plan for Secondary Diagnosis: Principal Problem:   Bipolar I disorder, current or most recent episode manic, with psychotic features (HCC)  Long Term Goal(s):     Short Term Goals:       Medication Management: Evaluate patient's response, side effects, and tolerance of medication regimen.  Therapeutic Interventions: 1 to 1 sessions, Unit Group sessions and Medication administration.  Evaluation of Outcomes: Progressing   RN Treatment Plan for Primary Diagnosis: Bipolar I disorder, current or most recent episode manic, with psychotic features (HCC) Long Term Goal(s): Knowledge of disease and therapeutic regimen to maintain health will improve  Short Term Goals: Ability to identify and develop effective coping behaviors will improve and Compliance with prescribed medications will improve  Medication Management: RN will administer medications as ordered by provider, will assess and evaluate patient's response and provide education to patient for prescribed medication. RN will report any adverse and/or side effects to prescribing provider.  Therapeutic Interventions: 1 on 1 counseling sessions, Psychoeducation, Medication administration, Evaluate responses to treatment, Monitor vital signs and CBGs as ordered, Perform/monitor CIWA, COWS,  AIMS and Fall Risk screenings as ordered, Perform wound care treatments as ordered.  Evaluation of Outcomes: Progressing   LCSW Treatment Plan for Primary Diagnosis: Bipolar I disorder, current or most  recent episode manic, with psychotic features (HCC) Long Term Goal(s): Safe transition to appropriate next level of care at discharge, Engage patient in therapeutic group addressing interpersonal concerns.  Short Term Goals: Engage patient in aftercare planning with referrals and resources, Identify triggers associated with mental health/substance abuse issues and Increase skills for wellness and recovery  Therapeutic Interventions: Assess for all discharge needs, 1 to 1 time with Social worker, Explore available resources and support systems, Assess for adequacy in community support network, Educate family and significant other(s) on suicide prevention, Complete Psychosocial Assessment, Interpersonal group therapy.  Evaluation of Outcomes: Progressing   Progress in Treatment: Attending groups: Yes. Participating in groups: Yes. Taking medication as prescribed: Yes. Toleration medication: Yes. Family/Significant other contact made: No, will contact:    Patient understands diagnosis: No Discussing patient identified problems/goals with staff: Yes. Medical problems stabilized or resolved: Yes. Denies suicidal/homicidal ideation: Yes. Issues/concerns per patient self-inventory: No. Other:    New problem(s) identified: No, Describe:     New Short Term/Long Term Goal(s):To be discharged and work on getting her car and arranging for a rental car.  Discharge Plan or Barriers: Still assessing, most likely will return home and follow up with outpatient provider.  Reason for Continuation of Hospitalization: Mania Medication stabilization  Estimated Length of Stay: 5-7 days  Attendees: Patient:Ruth Hood 01/28/2017 4:34 PM  Physician: Kristine Linea, MD 01/28/2017 4:34 PM  Nursing: Hulan Amato, RN 01/28/2017 4:34 PM  RN Care Manager: 01/28/2017 4:34 PM  Social Worker: Jake Shark, LCSW 01/28/2017 4:34 PM  Recreational Therapist:  01/28/2017 4:34 PM  Other:  01/28/2017 4:34 PM  Other:   01/28/2017 4:34 PM  Other: 01/28/2017 4:34 PM    Scribe for Treatment Team: Glennon Mac, LCSW 01/28/2017 4:34 PM

## 2017-01-28 NOTE — Plan of Care (Signed)
  Progressing Education: Utilization of techniques to improve thought processes will improve 01/28/2017 2027 - Progressing by Lelan PonsAriwodo, Ireland Chagnon, RN Knowledge of the prescribed therapeutic regimen will improve 01/28/2017 2027 - Progressing by Lelan PonsAriwodo, Johna Kearl, RN Coping: Ability to cope will improve 01/28/2017 2027 - Progressing by Lelan PonsAriwodo, Lajoyce Tamura, RN Ability to verbalize feelings will improve 01/28/2017 2027 - Progressing by Lelan PonsAriwodo, Moiz Ryant, RN Health Behavior/Discharge Planning: Ability to make decisions will improve 01/28/2017 2027 - Progressing by Lelan PonsAriwodo, Jody Silas, RN Safety: Ability to disclose and discuss suicidal ideas will improve 01/28/2017 2027 - Progressing by Lelan PonsAriwodo, Ady Heimann, RN Self-Concept: Ability to verbalize positive feelings about self will improve 01/28/2017 2027 - Progressing by Lelan PonsAriwodo, Kerri Kovacik, RN Self-Concept: Level of anxiety will decrease 01/28/2017 2027 - Progressing by Lelan PonsAriwodo, Mersadie Kavanaugh, RN Nutrition: Adequate nutrition will be maintained 01/28/2017 2027 - Progressing by Lelan PonsAriwodo, Darris Staiger, RN Pain Managment: General experience of comfort will improve 01/28/2017 2027 - Progressing by Lelan PonsAriwodo, Keymoni Mccaster, RN Safety: Ability to remain free from injury will improve 01/28/2017 2027 - Progressing by Lelan PonsAriwodo, Azarria Balint, RN
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
## 2017-01-29 DIAGNOSIS — F310 Bipolar disorder, current episode hypomanic: Secondary | ICD-10-CM | POA: Diagnosis present

## 2017-01-29 DIAGNOSIS — K219 Gastro-esophageal reflux disease without esophagitis: Secondary | ICD-10-CM | POA: Diagnosis present

## 2017-01-29 MED ORDER — TRAZODONE HCL 100 MG PO TABS: 100.0000 mg | ORAL_TABLET | Freq: Every day | ORAL | 1 refills | Status: AC

## 2017-01-29 MED ORDER — OLANZAPINE 10 MG PO TABS: 10.0000 mg | ORAL_TABLET | Freq: Every day | ORAL | 1 refills | Status: AC

## 2017-01-29 NOTE — [facility] Suicide Risk Assessment (Signed)
[facility] Discharge Suicide Risk Assessment   Principal Problem: Adjustment disorder with mixed disturbance of emotions and conduct Discharge Diagnoses:  Patient Active Problem List   Diagnosis Date Noted  . Adjustment disorder with mixed disturbance of emotions and conduct [F43.25] 01/26/2017    Priority: High  . Bipolar disorder, current episode hypomanic ([place]) [F31.0] 01/29/2017  . GERD (gastroesophageal reflux disease) [K21.9] 01/29/2017    Total Time spent with patient: 30 minutes  Musculoskeletal: Strength & Muscle Tone: within normal limits Gait & Station: normal Patient leans: N/A  Psychiatric Specialty Exam: Review of Systems  Musculoskeletal: Positive for myalgias.  Neurological: Negative.   Psychiatric/Behavioral: The patient has insomnia.   All other systems reviewed and are negative.   Blood pressure 114/64, pulse 84, temperature 98.6 F (37 C), temperature source Oral, resp. rate 18, height _0  (1.626 m), weight 74.8 kg (165 lb), SpO2 99 %.Body mass index is 28.32 kg/m.  General Appearance: Casual  Eye Contact::  Good  Speech:  Clear and Coherent409  Volume:  Normal  Mood:  Euthymic  Affect:  Appropriate  Thought Process:  Goal Directed and Descriptions of Associations: Intact  Orientation:  Full (Time, Place, and Person)  Thought Content:  WDL  Suicidal Thoughts:  No  Homicidal Thoughts:  No  Memory:  Immediate;   Fair Recent;   Fair Remote;   Fair  Judgement:  Impaired  Insight:  Present  Psychomotor Activity:  Normal  Concentration:  Fair  Recall:  [employer] of Knowledge:Fair  Language: Fair  Akathisia:  No  Handed:  Right  AIMS (if indicated):     Assets:  Communication Skills Desire for Improvement Financial Resources/Insurance [place] Talents/Skills Transportation Vocational/Educational  Sleep:  Number of Hours: 7.15  Cognition: WNL  ADL's:  Intact   Mental Status Per Nursing Assessment::    On Admission:     Demographic Factors:  Divorced or widowed and Caucasian  Loss Factors: Loss of significant relationship  Historical Factors: Family history of mental illness or substance abuse and Impulsivity  Risk Reduction Factors:   Sense of responsibility to family, Living with another person, especially a relative and Positive social support  Continued Clinical Symptoms:  Severe Anxiety and/or Agitation  Cognitive Features That Contribute To Risk:  None    Suicide Risk:  Minimal: No identifiable suicidal ideation.  Patients presenting with no risk factors but with morbid ruminations; may be classified as minimal risk based on the severity of the depressive symptoms  Follow-up Information    [facility] Follow up on 02/04/2017.   Why:  Please met with Sherrian Divers at the office on 02/04/17 at 7:15 AM. Contact information: [place] [address] [phone]        [facility], [place] .   Contact information: [address] [place] [address] [phone]           Plan Of Care/Follow-up recommendations:  Activity:  as tolerated Diet:  low sodium heart healthy Other:  keep follow up appointments  Orson Slick, MD 01/29/2017, 10:00 AM

## 2017-01-29 NOTE — Progress Notes (Signed)
Patient is pleasant and cooperative.  Bright affect.  Logical and coherent although hyper verbal.  Denies SI/HI/AVH.   Discharge instructions given, verbalized understanding.  Informed that doctor has written for a seven day supply of medications.  Patient informed that she would return to pick up medications once they are ready. Prescription given.  Personal belongings returned.  Escorted off unit by this Clinical research associatewriter to main entrance to travel home with friend.
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
## 2017-01-29 NOTE — Progress Notes (Signed)
  [facility] Adult Case Management Discharge Plan :  Will you be returning to the same living situation after discharge:  Yes,  Pt will be discharging back to her home At discharge, do you have transportation home?: Yes,  Pt's family is providing transportation Do you have the ability to pay for your medications: Yes,  Pt has financial resources.  Release of information consent forms completed and in the chart;  Patient's signature needed at discharge.  Patient to Follow up at: Follow-up Information    [facility] Follow up on 02/04/2017.   Why:  Please met with Sherrian Divers at the office on 02/04/17 at 7:15 AM. Contact information: [place] [address] [phone]        [facility], [place] .   Contact information: [address] [place] [address] [phone]           Next level of care provider has access to [place] and Suicide Prevention discussed: Yes,  No safety issues identified  Have you used any form of tobacco in the last 30 days? (Cigarettes, Smokeless Tobacco, Cigars, and/or Pipes): No  Has patient been referred to the Quitline?: N/A patient is not a smoker  Patient has been referred for addiction treatment: [place], LCSW 01/29/2017, 9:50 AM

## 2017-01-29 NOTE — Plan of Care (Signed)
Patient is adjusting well and communicating with her peers adequately , patient contract for safety of self and others and denies any suicide ideations , support and encouragement is rendered and acknowledged , patient is sleeping long hours at a time , no distress noted.

## 2017-01-29 NOTE — BHH Group Notes (Signed)
01/29/2017 9:30AM  Type of Therapy/Topic:  Group Therapy:  Balance in Life  Participation Level:  Active  Description of Group:   This group will address the concept of balance and how it feels and looks when one is unbalanced. Patients will be encouraged to process areas in their lives that are out of balance and identify reasons for remaining unbalanced. Facilitators will guide patients in utilizing problem-solving interventions to address and correct the stressor making their life unbalanced. Understanding and applying boundaries will be explored and addressed for obtaining and maintaining a balanced life. Patients will be encouraged to explore ways to assertively make their unbalanced needs known to significant others in their lives, using other group members and facilitator for support and feedback.  Therapeutic Goals: 1. Patient will identify two or more emotions or situations they have that consume much of in their lives. 2. Patient will identify signs/triggers that life has become out of balance:  3. Patient will identify two ways to set boundaries in order to achieve balance in their lives:  4. Patient will demonstrate ability to communicate their needs through discussion and/or role plays  Summary of Patient Progress: Actively and appropriately engaged in the group. Patient was able to provide support and validation to other group members.Patient practiced active listening when interacting with the facilitator and other group members Patient in still in the process of obtaining treatment goals. Zella BallRobin says that being in here has caused her routine to be off balance. She says that when she gets discharged she will need to maintain that balance again.     Therapeutic Modalities:   Cognitive Behavioral Therapy Solution-Focused Therapy Assertiveness Training  Johny Shearsassandra  Abygayle D[place], [place]

## 2019-08-16 ENCOUNTER — Other Ambulatory Visit: Payer: Self-pay

## 2019-08-16 ENCOUNTER — Emergency Department: Payer: No Typology Code available for payment source

## 2019-08-16 ENCOUNTER — Encounter: Payer: Self-pay | Admitting: Emergency Medicine

## 2019-08-16 ENCOUNTER — Emergency Department
Admission: EM | Admit: 2019-08-16 | Discharge: 2019-08-16 | Disposition: A | Discharge status: Home / Self Care | Payer: No Typology Code available for payment source | Attending: Emergency Medicine | Admitting: Emergency Medicine

## 2019-08-16 DIAGNOSIS — R519 Headache, unspecified: Secondary | ICD-10-CM | POA: Diagnosis not present

## 2019-08-16 DIAGNOSIS — Y999 Unspecified external cause status: Secondary | ICD-10-CM | POA: Insufficient documentation

## 2019-08-16 DIAGNOSIS — Y9389 Activity, other specified: Secondary | ICD-10-CM | POA: Diagnosis not present

## 2019-08-16 DIAGNOSIS — Z859 Personal history of malignant neoplasm, unspecified: Secondary | ICD-10-CM | POA: Diagnosis not present

## 2019-08-16 DIAGNOSIS — M545 Low back pain: Secondary | ICD-10-CM | POA: Insufficient documentation

## 2019-08-16 DIAGNOSIS — Y92481 Parking lot as the place of occurrence of the external cause: Secondary | ICD-10-CM | POA: Insufficient documentation

## 2019-08-16 MED ORDER — IBUPROFEN 400 MG PO TABS: 400.0000 mg | ORAL_TABLET | Freq: Four times a day (QID) | ORAL | 0 refills | Status: AC | PRN

## 2019-08-16 MED ORDER — BACLOFEN 5 MG PO TABS: 5.0000 mg | ORAL_TABLET | Freq: Three times a day (TID) | ORAL | 0 refills | Status: AC | PRN

## 2019-08-16 MED ORDER — LIDOCAINE 5 % EX PTCH: 1.0000 | MEDICATED_PATCH | CUTANEOUS | 0 refills | Status: AC
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
## 2019-08-16 NOTE — ED Notes (Signed)
Signature pad not working, pt verbalizes consent for d/c, denies questions/concerns at this time,, ambulatory, steady gait
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
